# Patient Record
Sex: Female | Born: 1935 | Race: White | Hispanic: No | Marital: Married | Smoking: Never smoker
Health system: Southern US, Community
[De-identification: ages and names within clinical notes are randomized; demographics above are authoritative.]

## PROBLEM LIST (undated history)

## (undated) DIAGNOSIS — I10 Essential (primary) hypertension: Secondary | ICD-10-CM

## (undated) DIAGNOSIS — I639 Cerebral infarction, unspecified: Secondary | ICD-10-CM

---

## 2015-07-28 DIAGNOSIS — I10 Essential (primary) hypertension: Secondary | ICD-10-CM | POA: Diagnosis not present

## 2015-07-28 DIAGNOSIS — I6782 Cerebral ischemia: Secondary | ICD-10-CM | POA: Diagnosis not present

## 2015-07-28 DIAGNOSIS — Z8673 Personal history of transient ischemic attack (TIA), and cerebral infarction without residual deficits: Secondary | ICD-10-CM | POA: Diagnosis not present

## 2015-07-28 DIAGNOSIS — I160 Hypertensive urgency: Secondary | ICD-10-CM | POA: Diagnosis not present

## 2015-07-28 DIAGNOSIS — I499 Cardiac arrhythmia, unspecified: Secondary | ICD-10-CM | POA: Diagnosis not present

## 2015-07-28 DIAGNOSIS — R51 Headache: Secondary | ICD-10-CM | POA: Diagnosis not present

## 2015-07-28 DIAGNOSIS — G4453 Primary thunderclap headache: Secondary | ICD-10-CM | POA: Diagnosis not present

## 2015-07-28 DIAGNOSIS — R202 Paresthesia of skin: Secondary | ICD-10-CM | POA: Diagnosis not present

## 2015-07-28 DIAGNOSIS — G319 Degenerative disease of nervous system, unspecified: Secondary | ICD-10-CM | POA: Diagnosis not present

## 2015-07-28 DIAGNOSIS — I6523 Occlusion and stenosis of bilateral carotid arteries: Secondary | ICD-10-CM | POA: Diagnosis not present

## 2015-07-28 DIAGNOSIS — I080 Rheumatic disorders of both mitral and aortic valves: Secondary | ICD-10-CM | POA: Diagnosis not present

## 2015-07-29 DIAGNOSIS — I351 Nonrheumatic aortic (valve) insufficiency: Secondary | ICD-10-CM | POA: Diagnosis not present

## 2015-07-29 DIAGNOSIS — I10 Essential (primary) hypertension: Secondary | ICD-10-CM | POA: Diagnosis not present

## 2015-07-29 DIAGNOSIS — I519 Heart disease, unspecified: Secondary | ICD-10-CM | POA: Diagnosis not present

## 2015-07-29 DIAGNOSIS — I517 Cardiomegaly: Secondary | ICD-10-CM | POA: Diagnosis not present

## 2015-07-29 DIAGNOSIS — R930 Abnormal findings on diagnostic imaging of skull and head, not elsewhere classified: Secondary | ICD-10-CM | POA: Diagnosis not present

## 2015-07-29 DIAGNOSIS — I358 Other nonrheumatic aortic valve disorders: Secondary | ICD-10-CM | POA: Diagnosis not present

## 2015-07-29 DIAGNOSIS — R202 Paresthesia of skin: Secondary | ICD-10-CM | POA: Diagnosis not present

## 2015-07-29 DIAGNOSIS — I6523 Occlusion and stenosis of bilateral carotid arteries: Secondary | ICD-10-CM | POA: Diagnosis not present

## 2015-07-29 DIAGNOSIS — I340 Nonrheumatic mitral (valve) insufficiency: Secondary | ICD-10-CM | POA: Diagnosis not present

## 2015-07-29 DIAGNOSIS — Z8673 Personal history of transient ischemic attack (TIA), and cerebral infarction without residual deficits: Secondary | ICD-10-CM | POA: Diagnosis not present

## 2015-07-29 DIAGNOSIS — I160 Hypertensive urgency: Secondary | ICD-10-CM | POA: Diagnosis not present

## 2015-08-05 DIAGNOSIS — R202 Paresthesia of skin: Secondary | ICD-10-CM | POA: Diagnosis not present

## 2015-08-05 DIAGNOSIS — I639 Cerebral infarction, unspecified: Secondary | ICD-10-CM | POA: Diagnosis not present

## 2015-08-05 DIAGNOSIS — I10 Essential (primary) hypertension: Secondary | ICD-10-CM | POA: Diagnosis not present

## 2015-09-21 DIAGNOSIS — M7989 Other specified soft tissue disorders: Secondary | ICD-10-CM | POA: Diagnosis not present

## 2015-09-23 DIAGNOSIS — M7989 Other specified soft tissue disorders: Secondary | ICD-10-CM | POA: Diagnosis not present

## 2015-10-11 DIAGNOSIS — M79642 Pain in left hand: Secondary | ICD-10-CM | POA: Diagnosis not present

## 2015-10-11 DIAGNOSIS — R600 Localized edema: Secondary | ICD-10-CM | POA: Diagnosis not present

## 2015-10-26 DIAGNOSIS — M65342 Trigger finger, left ring finger: Secondary | ICD-10-CM | POA: Diagnosis not present

## 2015-10-26 DIAGNOSIS — M25442 Effusion, left hand: Secondary | ICD-10-CM | POA: Diagnosis not present

## 2015-10-26 DIAGNOSIS — M25642 Stiffness of left hand, not elsewhere classified: Secondary | ICD-10-CM | POA: Diagnosis not present

## 2015-10-26 DIAGNOSIS — Z5189 Encounter for other specified aftercare: Secondary | ICD-10-CM | POA: Diagnosis not present

## 2015-10-26 DIAGNOSIS — M79642 Pain in left hand: Secondary | ICD-10-CM | POA: Diagnosis not present

## 2015-11-02 DIAGNOSIS — M25442 Effusion, left hand: Secondary | ICD-10-CM | POA: Diagnosis not present

## 2015-11-02 DIAGNOSIS — M79642 Pain in left hand: Secondary | ICD-10-CM | POA: Diagnosis not present

## 2015-11-02 DIAGNOSIS — Z5189 Encounter for other specified aftercare: Secondary | ICD-10-CM | POA: Diagnosis not present

## 2015-11-02 DIAGNOSIS — M25642 Stiffness of left hand, not elsewhere classified: Secondary | ICD-10-CM | POA: Diagnosis not present

## 2015-11-02 DIAGNOSIS — M65342 Trigger finger, left ring finger: Secondary | ICD-10-CM | POA: Diagnosis not present

## 2015-11-09 DIAGNOSIS — M25642 Stiffness of left hand, not elsewhere classified: Secondary | ICD-10-CM | POA: Diagnosis not present

## 2015-11-09 DIAGNOSIS — Z5189 Encounter for other specified aftercare: Secondary | ICD-10-CM | POA: Diagnosis not present

## 2015-11-09 DIAGNOSIS — M79642 Pain in left hand: Secondary | ICD-10-CM | POA: Diagnosis not present

## 2015-11-09 DIAGNOSIS — M25442 Effusion, left hand: Secondary | ICD-10-CM | POA: Diagnosis not present

## 2015-11-09 DIAGNOSIS — M65342 Trigger finger, left ring finger: Secondary | ICD-10-CM | POA: Diagnosis not present

## 2015-11-17 DIAGNOSIS — Z5189 Encounter for other specified aftercare: Secondary | ICD-10-CM | POA: Diagnosis not present

## 2015-11-17 DIAGNOSIS — M25442 Effusion, left hand: Secondary | ICD-10-CM | POA: Diagnosis not present

## 2015-11-17 DIAGNOSIS — M79642 Pain in left hand: Secondary | ICD-10-CM | POA: Diagnosis not present

## 2015-11-17 DIAGNOSIS — M65342 Trigger finger, left ring finger: Secondary | ICD-10-CM | POA: Diagnosis not present

## 2015-11-17 DIAGNOSIS — M25642 Stiffness of left hand, not elsewhere classified: Secondary | ICD-10-CM | POA: Diagnosis not present

## 2016-01-31 DIAGNOSIS — G8929 Other chronic pain: Secondary | ICD-10-CM | POA: Diagnosis not present

## 2016-01-31 DIAGNOSIS — E559 Vitamin D deficiency, unspecified: Secondary | ICD-10-CM | POA: Diagnosis not present

## 2016-01-31 DIAGNOSIS — M25511 Pain in right shoulder: Secondary | ICD-10-CM | POA: Diagnosis not present

## 2016-01-31 DIAGNOSIS — I10 Essential (primary) hypertension: Secondary | ICD-10-CM | POA: Diagnosis not present

## 2016-01-31 DIAGNOSIS — Z8673 Personal history of transient ischemic attack (TIA), and cerebral infarction without residual deficits: Secondary | ICD-10-CM | POA: Diagnosis not present

## 2016-02-02 DIAGNOSIS — I10 Essential (primary) hypertension: Secondary | ICD-10-CM | POA: Diagnosis not present

## 2016-02-02 DIAGNOSIS — I639 Cerebral infarction, unspecified: Secondary | ICD-10-CM | POA: Diagnosis not present

## 2016-02-02 DIAGNOSIS — E559 Vitamin D deficiency, unspecified: Secondary | ICD-10-CM | POA: Diagnosis not present

## 2016-04-26 DIAGNOSIS — H04123 Dry eye syndrome of bilateral lacrimal glands: Secondary | ICD-10-CM | POA: Diagnosis not present

## 2016-04-28 DIAGNOSIS — S00412A Abrasion of left ear, initial encounter: Secondary | ICD-10-CM | POA: Diagnosis not present

## 2016-04-28 DIAGNOSIS — H6121 Impacted cerumen, right ear: Secondary | ICD-10-CM | POA: Diagnosis not present

## 2016-05-04 DIAGNOSIS — Z23 Encounter for immunization: Secondary | ICD-10-CM | POA: Diagnosis not present

## 2016-08-08 DIAGNOSIS — I10 Essential (primary) hypertension: Secondary | ICD-10-CM | POA: Diagnosis not present

## 2016-08-08 DIAGNOSIS — I6340 Cerebral infarction due to embolism of unspecified cerebral artery: Secondary | ICD-10-CM | POA: Diagnosis not present

## 2016-08-08 DIAGNOSIS — E7800 Pure hypercholesterolemia, unspecified: Secondary | ICD-10-CM | POA: Diagnosis not present

## 2016-08-14 DIAGNOSIS — E7800 Pure hypercholesterolemia, unspecified: Secondary | ICD-10-CM | POA: Diagnosis not present

## 2016-08-14 DIAGNOSIS — I10 Essential (primary) hypertension: Secondary | ICD-10-CM | POA: Diagnosis not present

## 2016-11-22 DIAGNOSIS — H6123 Impacted cerumen, bilateral: Secondary | ICD-10-CM | POA: Diagnosis not present

## 2016-12-26 DIAGNOSIS — Z8673 Personal history of transient ischemic attack (TIA), and cerebral infarction without residual deficits: Secondary | ICD-10-CM | POA: Diagnosis not present

## 2016-12-26 DIAGNOSIS — I10 Essential (primary) hypertension: Secondary | ICD-10-CM | POA: Diagnosis not present

## 2016-12-26 DIAGNOSIS — E782 Mixed hyperlipidemia: Secondary | ICD-10-CM | POA: Diagnosis not present

## 2016-12-26 DIAGNOSIS — Z0000 Encounter for general adult medical examination without abnormal findings: Secondary | ICD-10-CM | POA: Diagnosis not present

## 2017-01-03 DIAGNOSIS — M6530 Trigger finger, unspecified finger: Secondary | ICD-10-CM | POA: Diagnosis not present

## 2017-01-03 DIAGNOSIS — M2669 Other specified disorders of temporomandibular joint: Secondary | ICD-10-CM | POA: Diagnosis not present

## 2017-02-22 DIAGNOSIS — M79641 Pain in right hand: Secondary | ICD-10-CM | POA: Diagnosis not present

## 2017-02-22 DIAGNOSIS — M65331 Trigger finger, right middle finger: Secondary | ICD-10-CM | POA: Diagnosis not present

## 2017-02-22 DIAGNOSIS — M79642 Pain in left hand: Secondary | ICD-10-CM | POA: Diagnosis not present

## 2017-02-22 DIAGNOSIS — M65332 Trigger finger, left middle finger: Secondary | ICD-10-CM | POA: Diagnosis not present

## 2017-05-21 DIAGNOSIS — Z23 Encounter for immunization: Secondary | ICD-10-CM | POA: Diagnosis not present

## 2017-06-07 DIAGNOSIS — D492 Neoplasm of unspecified behavior of bone, soft tissue, and skin: Secondary | ICD-10-CM | POA: Diagnosis not present

## 2017-06-07 DIAGNOSIS — D229 Melanocytic nevi, unspecified: Secondary | ICD-10-CM | POA: Diagnosis not present

## 2017-06-07 DIAGNOSIS — L820 Inflamed seborrheic keratosis: Secondary | ICD-10-CM | POA: Diagnosis not present

## 2017-06-21 DIAGNOSIS — J209 Acute bronchitis, unspecified: Secondary | ICD-10-CM | POA: Diagnosis not present

## 2017-06-21 DIAGNOSIS — R05 Cough: Secondary | ICD-10-CM | POA: Diagnosis not present

## 2017-06-21 DIAGNOSIS — J069 Acute upper respiratory infection, unspecified: Secondary | ICD-10-CM | POA: Diagnosis not present

## 2017-06-23 DIAGNOSIS — T50905A Adverse effect of unspecified drugs, medicaments and biological substances, initial encounter: Secondary | ICD-10-CM | POA: Diagnosis not present

## 2017-06-23 DIAGNOSIS — I10 Essential (primary) hypertension: Secondary | ICD-10-CM | POA: Diagnosis not present

## 2017-06-23 DIAGNOSIS — J069 Acute upper respiratory infection, unspecified: Secondary | ICD-10-CM | POA: Diagnosis not present

## 2017-06-23 DIAGNOSIS — R29810 Facial weakness: Secondary | ICD-10-CM | POA: Diagnosis not present

## 2017-06-26 DIAGNOSIS — J209 Acute bronchitis, unspecified: Secondary | ICD-10-CM | POA: Diagnosis not present

## 2017-06-26 DIAGNOSIS — J069 Acute upper respiratory infection, unspecified: Secondary | ICD-10-CM | POA: Diagnosis not present

## 2017-06-26 DIAGNOSIS — R05 Cough: Secondary | ICD-10-CM | POA: Diagnosis not present

## 2017-06-26 DIAGNOSIS — I10 Essential (primary) hypertension: Secondary | ICD-10-CM | POA: Diagnosis not present

## 2017-07-05 ENCOUNTER — Emergency Department (HOSPITAL_COMMUNITY): Payer: Medicare Other

## 2017-07-05 ENCOUNTER — Encounter (HOSPITAL_COMMUNITY): Payer: Self-pay

## 2017-07-05 ENCOUNTER — Emergency Department (HOSPITAL_COMMUNITY)
Admission: EM | Admit: 2017-07-05 | Discharge: 2017-07-05 | Disposition: A | Discharge status: Home / Self Care | Payer: Medicare Other | Attending: Emergency Medicine | Admitting: Emergency Medicine

## 2017-07-05 ENCOUNTER — Other Ambulatory Visit: Payer: Self-pay

## 2017-07-05 DIAGNOSIS — R42 Dizziness and giddiness: Secondary | ICD-10-CM | POA: Diagnosis not present

## 2017-07-05 DIAGNOSIS — R404 Transient alteration of awareness: Secondary | ICD-10-CM | POA: Diagnosis not present

## 2017-07-05 DIAGNOSIS — Z7902 Long term (current) use of antithrombotics/antiplatelets: Secondary | ICD-10-CM | POA: Insufficient documentation

## 2017-07-05 DIAGNOSIS — I10 Essential (primary) hypertension: Secondary | ICD-10-CM | POA: Diagnosis not present

## 2017-07-05 HISTORY — DX: Cerebral infarction, unspecified: I63.9

## 2017-07-05 HISTORY — DX: Essential (primary) hypertension: I10

## 2017-07-05 LAB — BASIC METABOLIC PANEL
Anion gap: 8 (ref 5–15)
BUN: 20 mg/dL (ref 6–20)
CHLORIDE: 102 mmol/L (ref 101–111)
CO2: 27 mmol/L (ref 22–32)
Calcium: 9.3 mg/dL (ref 8.9–10.3)
Creatinine, Ser: 0.55 mg/dL (ref 0.44–1.00)
GFR calc Af Amer: >60 mL/min (ref >60)
GFR calc non Af Amer: >60 mL/min (ref >60)
GLUCOSE: 102 mg/dL — AB (ref 65–99)
POTASSIUM: 4 mmol/L (ref 3.5–5.1)
Sodium: 137 mmol/L (ref 135–145)

## 2017-07-05 LAB — URINALYSIS, ROUTINE W REFLEX MICROSCOPIC
Bilirubin Urine: NEGATIVE
Glucose, UA: NEGATIVE mg/dL
HGB URINE DIPSTICK: NEGATIVE
Ketones, ur: NEGATIVE mg/dL
LEUKOCYTES UA: NEGATIVE
Nitrite: NEGATIVE
Protein, ur: NEGATIVE mg/dL
SPECIFIC GRAVITY, URINE: 1.008 (ref 1.005–1.030)
pH: 8 (ref 5.0–8.0)

## 2017-07-05 LAB — CBC
HCT: 40.7 % (ref 36.0–46.0)
HEMOGLOBIN: 13.2 g/dL (ref 12.0–15.0)
MCH: 28.6 pg (ref 26.0–34.0)
MCHC: 32.4 g/dL (ref 30.0–36.0)
MCV: 88.1 fL (ref 78.0–100.0)
PLATELETS: 199 10*3/uL (ref 150–400)
RBC: 4.62 MIL/uL (ref 3.87–5.11)
RDW: 14.2 % (ref 11.5–15.5)
WBC: 6.3 10*3/uL (ref 4.0–10.5)

## 2017-07-05 LAB — CBG MONITORING, ED: GLUCOSE-CAPILLARY: 98 mg/dL (ref 65–99)

## 2017-07-05 MED ORDER — SODIUM CHLORIDE 0.9 % IV BOLUS (SEPSIS)
500.0000 mL | Freq: Once | INTRAVENOUS | Status: AC
  Administered 2017-07-05: 500 mL via INTRAVENOUS

## 2017-07-05 MED ORDER — MECLIZINE HCL 25 MG PO TABS
12.5000 mg | ORAL_TABLET | Freq: Once | ORAL | Status: AC
  Administered 2017-07-05: 12.5 mg via ORAL
  Filled 2017-07-05: qty 1

## 2017-07-05 MED ORDER — MECLIZINE HCL 12.5 MG PO TABS: 12.5000 mg | ORAL_TABLET | Freq: Two times a day (BID) | ORAL | 0 refills | Status: AC | PRN

## 2017-07-05 NOTE — ED Triage Notes (Addendum)
Patient BIB EMS from home with c/o dizziness with onset of 0700 this morning. Patient reports "I feel fine when I lie down, but when I get up, I get really dizzy." Patient has history of HTN and stroke in 2000 with no deficits and x3 TIA's. Per EMS, patient AxOx4 and NIH=0.

## 2017-07-05 NOTE — Discharge Instructions (Signed)
Tests showed no life threatening condition.  Increase fluids.  Prescription for vertigo medication

## 2017-07-05 NOTE — ED Notes (Signed)
Bed: WA06 Expected date:  Expected time:  Means of arrival:  Comments: EMS 83 yo female dizziness

## 2017-07-05 NOTE — ED Notes (Addendum)
Pt ambulated around the department with no difficulties or dizzy spells.

## 2017-07-05 NOTE — ED Provider Notes (Signed)
[place] DEPT Provider Note   CSN: [id number] Arrival date & time: 07/05/17  9449     History   Chief Complaint Chief Complaint  Patient presents with  . Dizziness    HPI Bonnie Hart is a 83 y.o. female.  Sense of dizziness with room spinning at 07 100 today after getting up from bed to go the bathroom.  Symptoms improved with lying down.  No prodromal illnesses.  No gross neurological deficits.  Specifically, no motor or sensory deficits, stiff neck, facial drooping, confusion, fever, sweats, chills.  Past medical history includes hypertension and CVA.      Past Medical History:  Diagnosis Date  . Hypertension   . Stroke [facility])     There are no active problems to display for this patient.   History reviewed. No pertinent surgical history.  OB History    No data available       Home Medications    Prior to Admission medications   Medication Sig Start Date End Date Taking? Authorizing Provider  amLODipine (NORVASC) 5 MG tablet Take 5 mg by mouth daily. 05/20/17  Yes [provider]  benzonatate (TESSALON) 100 MG capsule Take 100 mg by mouth every 4 (four) hours as needed for cough. 06/21/17  Yes [provider]  Calcium Carb-Cholecalciferol (CALCIUM-VITAMIN D) 500-200 MG-UNIT tablet Take 1 tablet by mouth daily.   Yes [provider]  carvedilol (COREG) 6.25 MG tablet Take 6.25 mg by mouth 2 (two) times daily. 05/11/17  Yes [provider]  clopidogrel (PLAVIX) 75 MG tablet Take 75 mg by mouth daily. 05/04/17  Yes [provider]  levofloxacin (LEVAQUIN) 500 MG tablet Take 500 mg by mouth daily. 06/21/17  Yes [provider]  lisinopril (PRINIVIL,ZESTRIL) 20 MG tablet Take 20 mg by mouth 2 (two) times daily.  06/27/17  Yes [provider]  simvastatin (ZOCOR) 10 MG tablet Take 10 mg by mouth daily. 06/21/17  Yes [provider]  meclizine (ANTIVERT) 12.5 MG  tablet Take 1 tablet (12.5 mg total) by mouth 2 (two) times daily as needed for dizziness. 07/05/17   Nat Christen, MD    Family History No family history on file.  Social History Social History   Tobacco Use  . Smoking status: Never Smoker  . Smokeless tobacco: Never Used  Substance Use Topics  . Alcohol use: No    Frequency: Never  . Drug use: No     Allergies   Lidocaine and Levaquin [levofloxacin]   Review of Systems Review of Systems  All other systems reviewed and are negative.    Physical Exam Updated Vital Signs BP (!) 158/71 (BP Location: Right Arm)   Pulse 68   Temp 98.5 F (36.9 C) (Oral)   Resp 16   SpO2 97%   Physical Exam  Constitutional: She is oriented to person, place, and time. She appears well-developed and well-nourished.  HENT:  Head: Normocephalic and atraumatic.  Eyes: Conjunctivae are normal.  Neck: Neck supple.  Cardiovascular: Normal rate and regular rhythm.  Pulmonary/Chest: Effort normal and breath sounds normal.  Abdominal: Soft. Bowel sounds are normal.  Musculoskeletal: Normal range of motion.  Neurological: She is alert and oriented to person, place, and time.  Skin: Skin is warm and dry.  Psychiatric: She has a normal mood and affect. Her behavior is normal.  Nursing note and vitals reviewed.    ED Treatments / Results  Labs (all labs ordered are listed, but only abnormal  results are displayed) Labs Reviewed  BASIC METABOLIC PANEL - Abnormal; Notable for the following components:      Result Value   Glucose, Bld 102 (*)    All other components within normal limits  URINALYSIS, ROUTINE W REFLEX MICROSCOPIC - Abnormal; Notable for the following components:   Color, Urine COLORLESS (*)    All other components within normal limits  CBC  CBG MONITORING, ED    EKG  EKG Interpretation  Date/Time:  Thursday July 05 2017 10:12:28 EST Ventricular Rate:  66 PR Interval:    QRS Duration: 96 QT Interval:  401 QTC  Calculation: 421 R Axis:   75 Text Interpretation:  Sinus arrhythmia Confirmed by Nat Christen [id number]) on 07/05/2017 12:17:46 PM       Radiology Ct Head Wo Contrast  Result Date: 07/05/2017 CLINICAL DATA:  Dizziness beginning this morning.  No known injury. EXAM: CT HEAD WITHOUT CONTRAST TECHNIQUE: Contiguous axial images were obtained from the base of the skull through the vertex without intravenous contrast. COMPARISON:  None. FINDINGS: Brain: Hypoattenuation in the subcortical and periventricular deep white matter consistent chronic microvascular ischemic change is identified. No evidence of acute abnormality including hemorrhage, infarct, mass lesion, mass effect, midline shift or abnormal extra-axial fluid collection. No hydrocephalus or pneumocephalus. Vascular: No hyperdense vessel or unexpected calcification. Skull: Intact. Sinuses/Orbits: Negative. Other: None. IMPRESSION: No acute abnormality. Chronic microvascular ischemic change. Electronically Signed   By: Inge Rise M.D.   On: 07/05/2017 11:32    Procedures Procedures (including critical care time)  Medications Ordered in ED Medications  sodium chloride 0.9 % bolus 500 mL (0 mLs Intravenous Stopped 07/05/17 1256)  meclizine (ANTIVERT) tablet 12.5 mg (12.5 mg Oral Given 07/05/17 1255)     Initial Impression / Assessment and Plan / ED Course  I have reviewed the triage vital signs and the nursing notes.  Pertinent labs & imaging results that were available during my care of the patient were reviewed by me and considered in my medical decision making (see chart for details).     Patient is alert and oriented x3 without neurological deficits.  Screening tests including labs, EKG, CT head all negative.  Patient was ambulatory at discharge without vertigo.  Discharge medications Antivert 12.5 mg  Final Clinical Impressions(s) / ED Diagnoses   Final diagnoses:  Vertigo    ED Discharge Orders        Ordered     meclizine (ANTIVERT) 12.5 MG tablet  2 times daily PRN     07/05/17 1350       Nat Christen, MD 07/05/17 1415

## 2017-07-05 NOTE — ED Notes (Signed)
Patient transported to CT 

## 2017-09-25 DIAGNOSIS — M65331 Trigger finger, right middle finger: Secondary | ICD-10-CM | POA: Diagnosis not present

## 2017-09-25 DIAGNOSIS — M65321 Trigger finger, right index finger: Secondary | ICD-10-CM | POA: Diagnosis not present

## 2017-10-04 DIAGNOSIS — J209 Acute bronchitis, unspecified: Secondary | ICD-10-CM | POA: Diagnosis not present

## 2017-10-04 DIAGNOSIS — H109 Unspecified conjunctivitis: Secondary | ICD-10-CM | POA: Diagnosis not present

## 2017-10-04 DIAGNOSIS — I10 Essential (primary) hypertension: Secondary | ICD-10-CM | POA: Diagnosis not present

## 2017-10-04 DIAGNOSIS — E7800 Pure hypercholesterolemia, unspecified: Secondary | ICD-10-CM | POA: Diagnosis not present

## 2017-10-04 DIAGNOSIS — R0981 Nasal congestion: Secondary | ICD-10-CM | POA: Diagnosis not present

## 2017-10-12 DIAGNOSIS — M65331 Trigger finger, right middle finger: Secondary | ICD-10-CM | POA: Diagnosis not present

## 2017-10-12 DIAGNOSIS — M65321 Trigger finger, right index finger: Secondary | ICD-10-CM | POA: Diagnosis not present

## 2017-11-27 DIAGNOSIS — H6121 Impacted cerumen, right ear: Secondary | ICD-10-CM | POA: Diagnosis not present

## 2017-12-31 DIAGNOSIS — E782 Mixed hyperlipidemia: Secondary | ICD-10-CM | POA: Diagnosis not present

## 2017-12-31 DIAGNOSIS — Z0000 Encounter for general adult medical examination without abnormal findings: Secondary | ICD-10-CM | POA: Diagnosis not present

## 2017-12-31 DIAGNOSIS — I10 Essential (primary) hypertension: Secondary | ICD-10-CM | POA: Diagnosis not present

## 2018-01-15 DIAGNOSIS — H01006 Unspecified blepharitis left eye, unspecified eyelid: Secondary | ICD-10-CM | POA: Diagnosis not present

## 2018-02-13 DIAGNOSIS — M26629 Arthralgia of temporomandibular joint, unspecified side: Secondary | ICD-10-CM | POA: Diagnosis not present

## 2018-03-29 DIAGNOSIS — M62838 Other muscle spasm: Secondary | ICD-10-CM | POA: Diagnosis not present

## 2018-03-29 DIAGNOSIS — F458 Other somatoform disorders: Secondary | ICD-10-CM | POA: Diagnosis not present

## 2018-05-03 DIAGNOSIS — Z23 Encounter for immunization: Secondary | ICD-10-CM | POA: Diagnosis not present

## 2018-06-20 IMAGING — CT CT HEAD W/O CM
3 series · 15 of 47 positions shown, 18 images · non-contrast
Comparison: None.

CLINICAL DATA: Dizziness beginning this morning.  No known injury.

EXAM:
CT HEAD WITHOUT CONTRAST
TECHNIQUE: Contiguous axial images were obtained from the base of the skull
through the vertex without intravenous contrast.

[Series 3: head wo · axial · 0.44mm/px · z∈[-152,-27]mm · 9 of 30 slices shown, 12 images]
[im 3/30  brain]
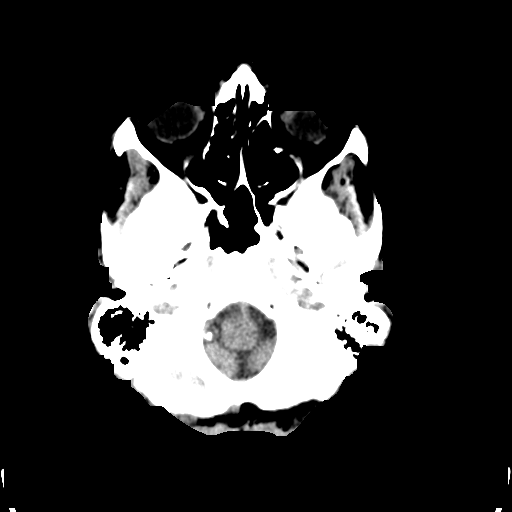
[im 3/30  bone]
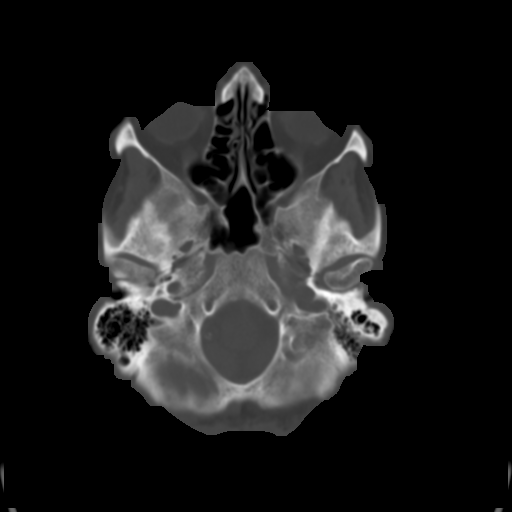
[im 6/30  brain]
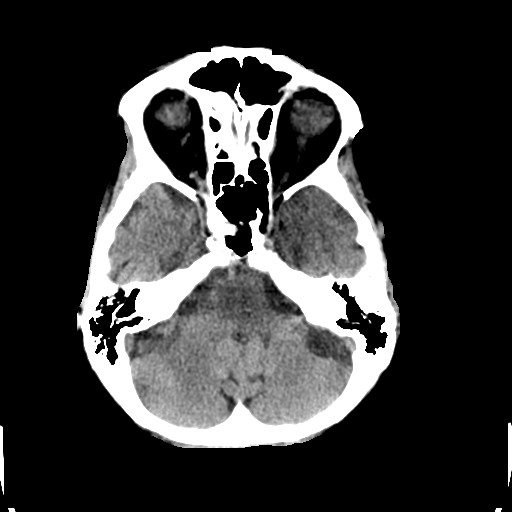
[im 9/30  brain]
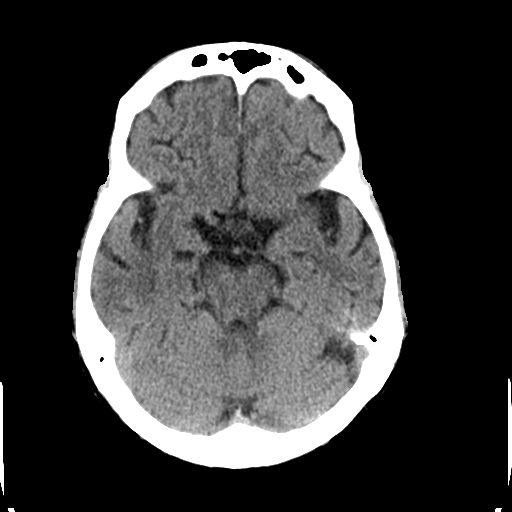
[im 12/30  brain]
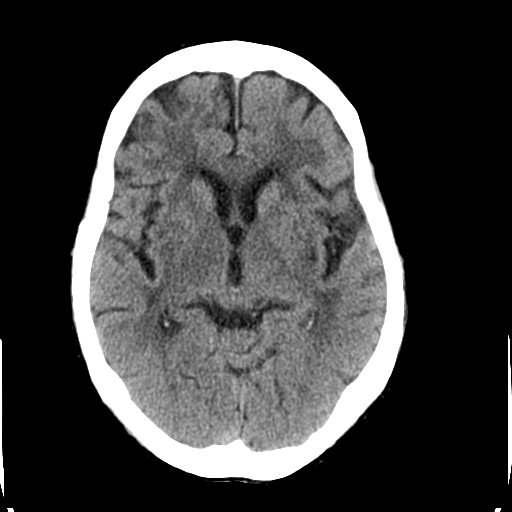
[im 16/30  brain]
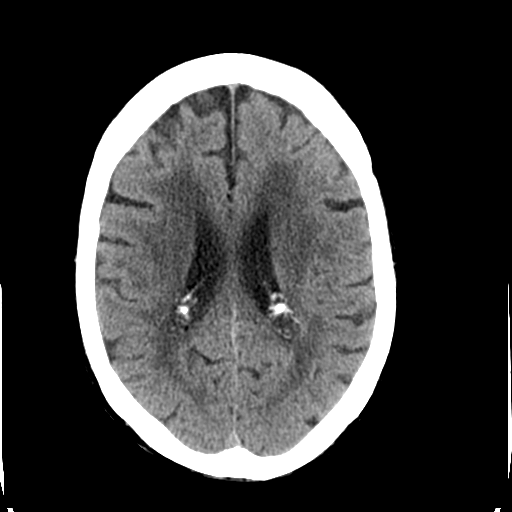
[im 16/30  bone]
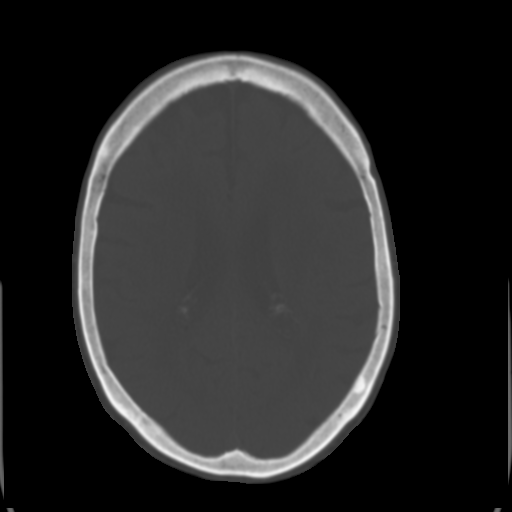
[im 19/30  brain]
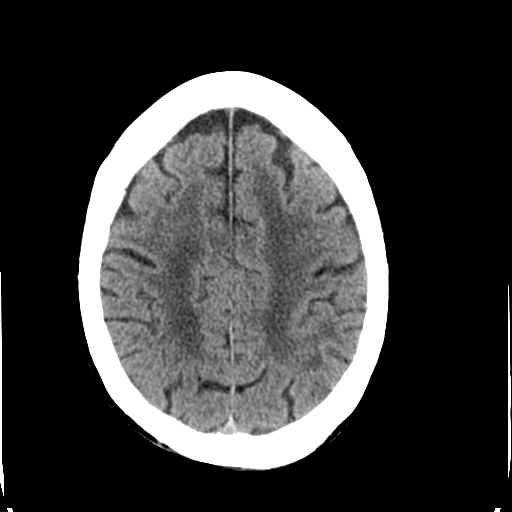
[im 22/30  brain]
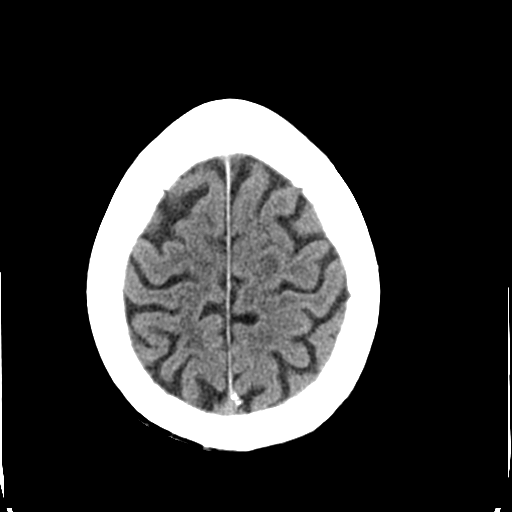
[im 25/30  brain]
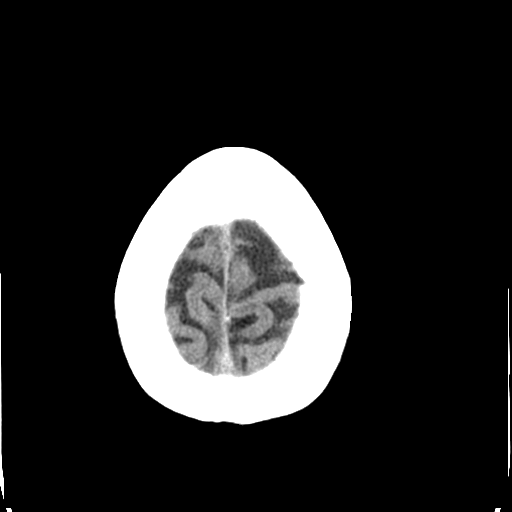
[im 28/30  brain]
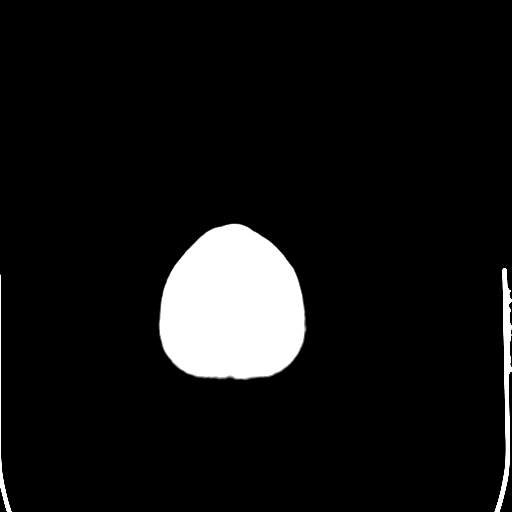
[im 28/30  bone]
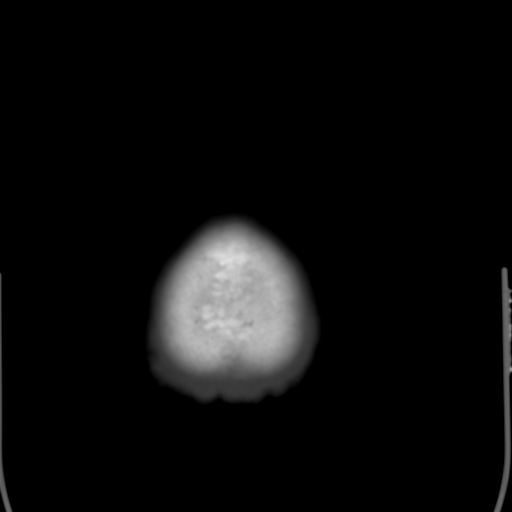

[Series 6: coronal soft tissue · coronal · 0.30mm/px · 3 of 67 slices shown]
[im 23/67  brain]
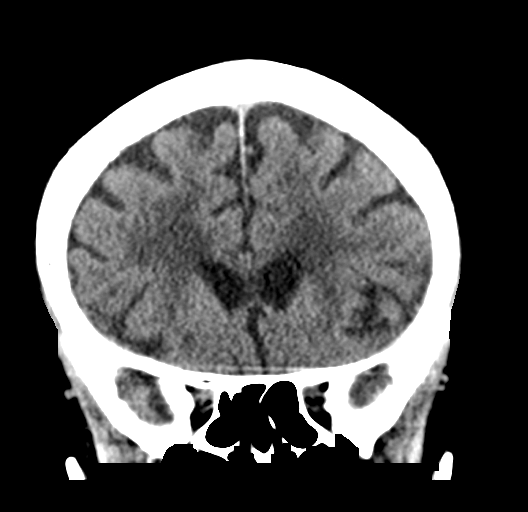
[im 30/67  brain]
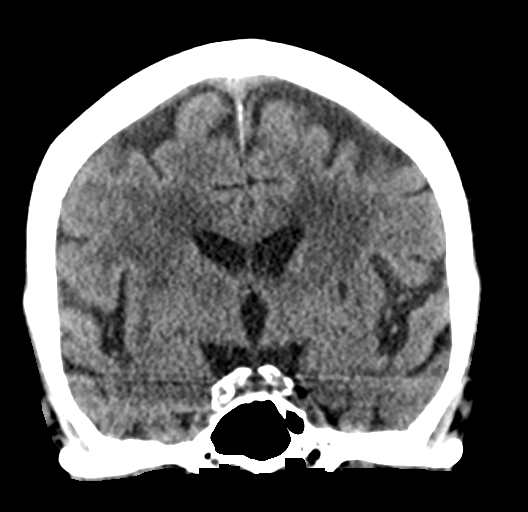
[im 37/67  brain]
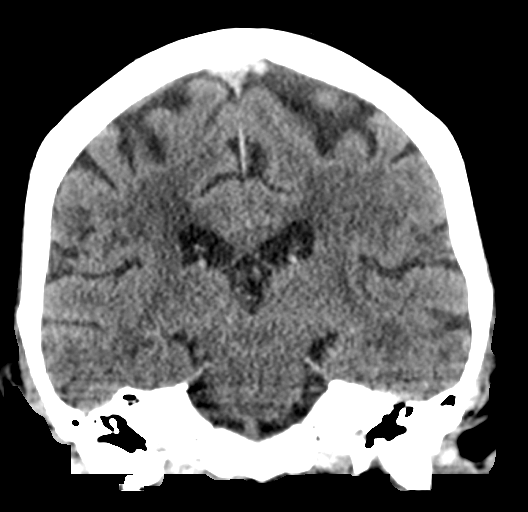

[Series 7: sagittal soft tissue · sagittal · 0.30mm/px · 3 of 52 slices shown]
[im 18/52  brain]
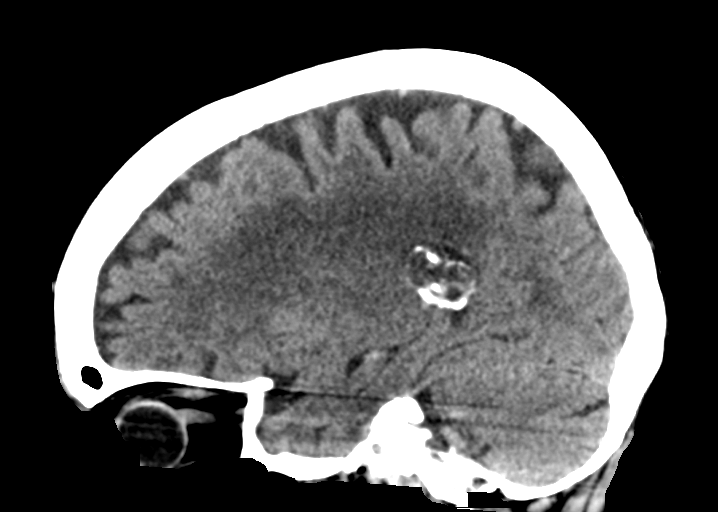
[im 26/52  brain]
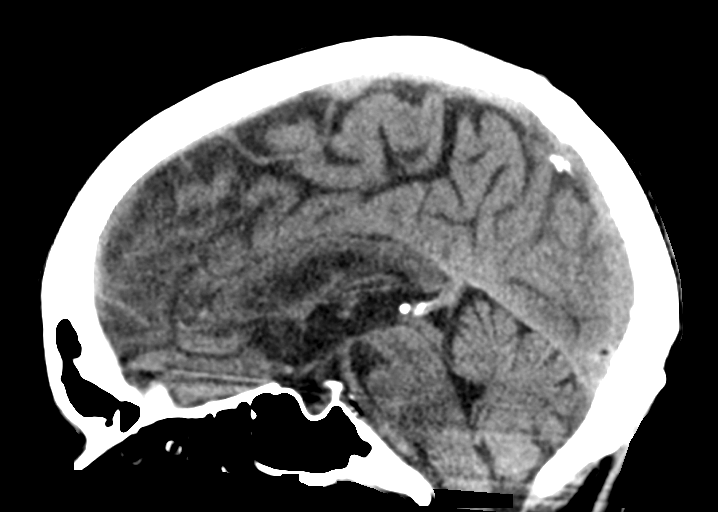
[im 35/52  brain]
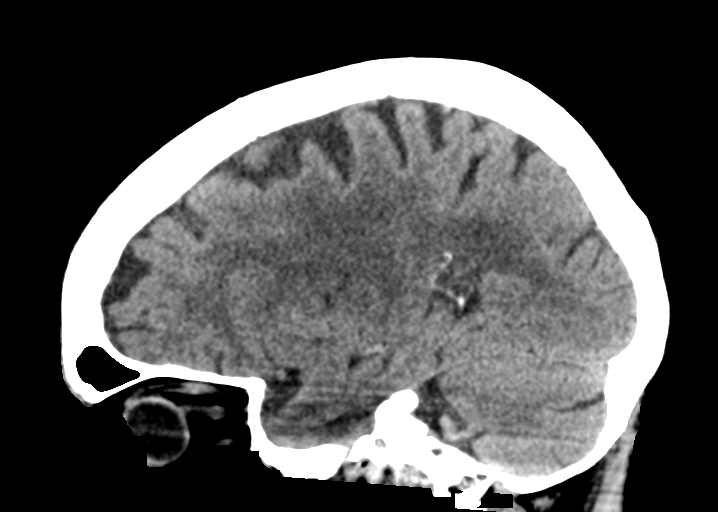

[15 of 47 positions shown; findings below may reference images not displayed]

FINDINGS: Brain: Hypoattenuation in the subcortical and periventricular deep
white matter consistent chronic microvascular ischemic change is
identified. No evidence of acute abnormality including hemorrhage,
infarct, mass lesion, mass effect, midline shift or abnormal
extra-axial fluid collection. No hydrocephalus or pneumocephalus.

Vascular: No hyperdense vessel or unexpected calcification.

Skull: Intact.

Sinuses/Orbits: Negative.

Other: None.
IMPRESSION: No acute abnormality.

Chronic microvascular ischemic change.

## 2019-01-29 DIAGNOSIS — E782 Mixed hyperlipidemia: Secondary | ICD-10-CM | POA: Diagnosis not present

## 2019-01-29 DIAGNOSIS — I10 Essential (primary) hypertension: Secondary | ICD-10-CM | POA: Diagnosis not present

## 2019-01-31 DIAGNOSIS — E782 Mixed hyperlipidemia: Secondary | ICD-10-CM | POA: Diagnosis not present

## 2019-01-31 DIAGNOSIS — I10 Essential (primary) hypertension: Secondary | ICD-10-CM | POA: Diagnosis not present

## 2019-01-31 DIAGNOSIS — Z0000 Encounter for general adult medical examination without abnormal findings: Secondary | ICD-10-CM | POA: Diagnosis not present

## 2019-01-31 DIAGNOSIS — Z1211 Encounter for screening for malignant neoplasm of colon: Secondary | ICD-10-CM | POA: Diagnosis not present

## 2019-03-07 DIAGNOSIS — M25561 Pain in right knee: Secondary | ICD-10-CM | POA: Diagnosis not present

## 2019-03-07 DIAGNOSIS — T1490XA Injury, unspecified, initial encounter: Secondary | ICD-10-CM | POA: Diagnosis not present

## 2019-03-25 DIAGNOSIS — Z23 Encounter for immunization: Secondary | ICD-10-CM | POA: Diagnosis not present

## 2020-07-26 DIAGNOSIS — I10 Essential (primary) hypertension: Secondary | ICD-10-CM | POA: Diagnosis not present

## 2020-07-26 DIAGNOSIS — R42 Dizziness and giddiness: Secondary | ICD-10-CM | POA: Diagnosis not present

## 2020-07-26 DIAGNOSIS — E782 Mixed hyperlipidemia: Secondary | ICD-10-CM | POA: Diagnosis not present

## 2020-08-10 DIAGNOSIS — U071 COVID-19: Secondary | ICD-10-CM | POA: Diagnosis not present

## 2020-08-19 DIAGNOSIS — I10 Essential (primary) hypertension: Secondary | ICD-10-CM | POA: Diagnosis not present

## 2020-08-19 DIAGNOSIS — R42 Dizziness and giddiness: Secondary | ICD-10-CM | POA: Diagnosis not present

## 2020-08-19 DIAGNOSIS — E782 Mixed hyperlipidemia: Secondary | ICD-10-CM | POA: Diagnosis not present

## 2020-08-23 DIAGNOSIS — E782 Mixed hyperlipidemia: Secondary | ICD-10-CM | POA: Diagnosis not present

## 2020-08-23 DIAGNOSIS — R42 Dizziness and giddiness: Secondary | ICD-10-CM | POA: Diagnosis not present

## 2020-08-23 DIAGNOSIS — U071 COVID-19: Secondary | ICD-10-CM | POA: Diagnosis not present

## 2020-08-23 DIAGNOSIS — I10 Essential (primary) hypertension: Secondary | ICD-10-CM | POA: Diagnosis not present

## 2020-09-12 DIAGNOSIS — R5383 Other fatigue: Secondary | ICD-10-CM | POA: Diagnosis not present

## 2020-09-13 DIAGNOSIS — U071 COVID-19: Secondary | ICD-10-CM | POA: Diagnosis not present

## 2020-09-13 DIAGNOSIS — D72829 Elevated white blood cell count, unspecified: Secondary | ICD-10-CM | POA: Diagnosis not present

## 2020-09-13 DIAGNOSIS — R630 Anorexia: Secondary | ICD-10-CM | POA: Diagnosis not present

## 2020-09-13 DIAGNOSIS — E860 Dehydration: Secondary | ICD-10-CM | POA: Diagnosis not present

## 2020-09-13 DIAGNOSIS — E782 Mixed hyperlipidemia: Secondary | ICD-10-CM | POA: Diagnosis not present

## 2020-09-13 DIAGNOSIS — E875 Hyperkalemia: Secondary | ICD-10-CM | POA: Diagnosis not present

## 2020-09-13 DIAGNOSIS — R42 Dizziness and giddiness: Secondary | ICD-10-CM | POA: Diagnosis not present

## 2020-09-13 DIAGNOSIS — E871 Hypo-osmolality and hyponatremia: Secondary | ICD-10-CM | POA: Diagnosis not present

## 2020-09-13 DIAGNOSIS — I10 Essential (primary) hypertension: Secondary | ICD-10-CM | POA: Diagnosis not present

## 2020-09-14 DIAGNOSIS — D649 Anemia, unspecified: Secondary | ICD-10-CM | POA: Diagnosis not present

## 2020-09-14 DIAGNOSIS — N390 Urinary tract infection, site not specified: Secondary | ICD-10-CM | POA: Diagnosis not present

## 2020-09-15 DIAGNOSIS — I10 Essential (primary) hypertension: Secondary | ICD-10-CM | POA: Diagnosis not present

## 2020-09-15 DIAGNOSIS — E782 Mixed hyperlipidemia: Secondary | ICD-10-CM | POA: Diagnosis not present

## 2020-09-15 DIAGNOSIS — R627 Adult failure to thrive: Secondary | ICD-10-CM | POA: Diagnosis not present

## 2020-09-15 DIAGNOSIS — R110 Nausea: Secondary | ICD-10-CM | POA: Diagnosis not present

## 2020-09-15 DIAGNOSIS — U071 COVID-19: Secondary | ICD-10-CM | POA: Diagnosis not present

## 2020-09-15 DIAGNOSIS — E875 Hyperkalemia: Secondary | ICD-10-CM | POA: Diagnosis not present

## 2020-09-15 DIAGNOSIS — E871 Hypo-osmolality and hyponatremia: Secondary | ICD-10-CM | POA: Diagnosis not present

## 2020-09-15 DIAGNOSIS — E860 Dehydration: Secondary | ICD-10-CM | POA: Diagnosis not present

## 2020-09-15 DIAGNOSIS — R630 Anorexia: Secondary | ICD-10-CM | POA: Diagnosis not present

## 2020-09-17 DIAGNOSIS — E785 Hyperlipidemia, unspecified: Secondary | ICD-10-CM | POA: Diagnosis not present

## 2020-09-17 DIAGNOSIS — I10 Essential (primary) hypertension: Secondary | ICD-10-CM | POA: Diagnosis not present

## 2020-09-17 DIAGNOSIS — I69391 Dysphagia following cerebral infarction: Secondary | ICD-10-CM | POA: Diagnosis not present

## 2020-09-17 DIAGNOSIS — B351 Tinea unguium: Secondary | ICD-10-CM | POA: Diagnosis not present

## 2020-09-17 DIAGNOSIS — R42 Dizziness and giddiness: Secondary | ICD-10-CM | POA: Diagnosis not present

## 2020-09-17 DIAGNOSIS — Z681 Body mass index (BMI) 19 or less, adult: Secondary | ICD-10-CM | POA: Diagnosis not present

## 2020-09-17 DIAGNOSIS — Z87440 Personal history of urinary (tract) infections: Secondary | ICD-10-CM | POA: Diagnosis not present

## 2020-09-17 DIAGNOSIS — I69318 Other symptoms and signs involving cognitive functions following cerebral infarction: Secondary | ICD-10-CM | POA: Diagnosis not present

## 2020-09-18 DIAGNOSIS — I69318 Other symptoms and signs involving cognitive functions following cerebral infarction: Secondary | ICD-10-CM | POA: Diagnosis not present

## 2020-09-18 DIAGNOSIS — I10 Essential (primary) hypertension: Secondary | ICD-10-CM | POA: Diagnosis not present

## 2020-09-18 DIAGNOSIS — I69391 Dysphagia following cerebral infarction: Secondary | ICD-10-CM | POA: Diagnosis not present

## 2020-09-18 DIAGNOSIS — E785 Hyperlipidemia, unspecified: Secondary | ICD-10-CM | POA: Diagnosis not present

## 2020-09-18 DIAGNOSIS — R42 Dizziness and giddiness: Secondary | ICD-10-CM | POA: Diagnosis not present

## 2020-09-18 DIAGNOSIS — Z87440 Personal history of urinary (tract) infections: Secondary | ICD-10-CM | POA: Diagnosis not present

## 2020-09-19 DIAGNOSIS — E785 Hyperlipidemia, unspecified: Secondary | ICD-10-CM | POA: Diagnosis not present

## 2020-09-19 DIAGNOSIS — I10 Essential (primary) hypertension: Secondary | ICD-10-CM | POA: Diagnosis not present

## 2020-09-19 DIAGNOSIS — I69318 Other symptoms and signs involving cognitive functions following cerebral infarction: Secondary | ICD-10-CM | POA: Diagnosis not present

## 2020-09-19 DIAGNOSIS — Z87440 Personal history of urinary (tract) infections: Secondary | ICD-10-CM | POA: Diagnosis not present

## 2020-09-19 DIAGNOSIS — R42 Dizziness and giddiness: Secondary | ICD-10-CM | POA: Diagnosis not present

## 2020-09-19 DIAGNOSIS — I69391 Dysphagia following cerebral infarction: Secondary | ICD-10-CM | POA: Diagnosis not present

## 2020-09-20 DIAGNOSIS — Z87440 Personal history of urinary (tract) infections: Secondary | ICD-10-CM | POA: Diagnosis not present

## 2020-09-20 DIAGNOSIS — E860 Dehydration: Secondary | ICD-10-CM | POA: Diagnosis not present

## 2020-09-20 DIAGNOSIS — E785 Hyperlipidemia, unspecified: Secondary | ICD-10-CM | POA: Diagnosis not present

## 2020-09-20 DIAGNOSIS — I10 Essential (primary) hypertension: Secondary | ICD-10-CM | POA: Diagnosis not present

## 2020-09-20 DIAGNOSIS — I69391 Dysphagia following cerebral infarction: Secondary | ICD-10-CM | POA: Diagnosis not present

## 2020-09-20 DIAGNOSIS — E782 Mixed hyperlipidemia: Secondary | ICD-10-CM | POA: Diagnosis not present

## 2020-09-20 DIAGNOSIS — R627 Adult failure to thrive: Secondary | ICD-10-CM | POA: Diagnosis not present

## 2020-09-20 DIAGNOSIS — R630 Anorexia: Secondary | ICD-10-CM | POA: Diagnosis not present

## 2020-09-20 DIAGNOSIS — U071 COVID-19: Secondary | ICD-10-CM | POA: Diagnosis not present

## 2020-09-20 DIAGNOSIS — R42 Dizziness and giddiness: Secondary | ICD-10-CM | POA: Diagnosis not present

## 2020-09-20 DIAGNOSIS — I69318 Other symptoms and signs involving cognitive functions following cerebral infarction: Secondary | ICD-10-CM | POA: Diagnosis not present

## 2020-09-21 DIAGNOSIS — Z87440 Personal history of urinary (tract) infections: Secondary | ICD-10-CM | POA: Diagnosis not present

## 2020-09-21 DIAGNOSIS — B351 Tinea unguium: Secondary | ICD-10-CM | POA: Diagnosis not present

## 2020-09-21 DIAGNOSIS — E785 Hyperlipidemia, unspecified: Secondary | ICD-10-CM | POA: Diagnosis not present

## 2020-09-21 DIAGNOSIS — R42 Dizziness and giddiness: Secondary | ICD-10-CM | POA: Diagnosis not present

## 2020-09-21 DIAGNOSIS — Z681 Body mass index (BMI) 19 or less, adult: Secondary | ICD-10-CM | POA: Diagnosis not present

## 2020-09-21 DIAGNOSIS — I10 Essential (primary) hypertension: Secondary | ICD-10-CM | POA: Diagnosis not present

## 2020-09-21 DIAGNOSIS — I69318 Other symptoms and signs involving cognitive functions following cerebral infarction: Secondary | ICD-10-CM | POA: Diagnosis not present

## 2020-09-21 DIAGNOSIS — I69391 Dysphagia following cerebral infarction: Secondary | ICD-10-CM | POA: Diagnosis not present

## 2020-10-22 DEATH — deceased
